# Patient Record
Sex: Male | Born: 1990 | Race: Black or African American | Hispanic: No | Marital: Single | Smoking: Never smoker
Health system: Southern US, Community
[De-identification: ages and names within clinical notes are randomized; demographics above are authoritative.]

## PROBLEM LIST (undated history)

## (undated) DIAGNOSIS — K08409 Partial loss of teeth, unspecified cause, unspecified class: Secondary | ICD-10-CM

## (undated) DIAGNOSIS — Z8619 Personal history of other infectious and parasitic diseases: Secondary | ICD-10-CM

## (undated) HISTORY — DX: Personal history of other infectious and parasitic diseases: Z86.19

## (undated) HISTORY — DX: Partial loss of teeth, unspecified cause, unspecified class: K08.409

## (undated) HISTORY — PX: EXTERNAL EAR SURGERY: SHX627

---

## 2012-04-30 ENCOUNTER — Ambulatory Visit (INDEPENDENT_AMBULATORY_CARE_PROVIDER_SITE_OTHER): Payer: Managed Care, Other (non HMO) | Admitting: Family Medicine

## 2012-04-30 ENCOUNTER — Encounter: Payer: Self-pay | Admitting: Family Medicine

## 2012-04-30 VITALS — BP 110/68 | HR 80 | Temp 98.5°F | Ht 70.25 in | Wt 219.2 lb

## 2012-04-30 DIAGNOSIS — Z0000 Encounter for general adult medical examination without abnormal findings: Secondary | ICD-10-CM

## 2012-04-30 DIAGNOSIS — Z209 Contact with and (suspected) exposure to unspecified communicable disease: Secondary | ICD-10-CM

## 2012-04-30 DIAGNOSIS — Z131 Encounter for screening for diabetes mellitus: Secondary | ICD-10-CM

## 2012-04-30 DIAGNOSIS — Z13220 Encounter for screening for lipoid disorders: Secondary | ICD-10-CM

## 2012-04-30 LAB — BASIC METABOLIC PANEL
BUN: 11 mg/dL (ref 6–23)
Chloride: 102 mEq/L (ref 96–112)
Glucose, Bld: 69 mg/dL — ABNORMAL LOW (ref 70–99)
Potassium: 4.1 mEq/L (ref 3.5–5.1)

## 2012-04-30 NOTE — Progress Notes (Signed)
Nature conservation officer at [facility] [address] [place] [address] Phone: [phone] Fax: [phone]   Patient Name: Frank Macdonald Date of Birth: 01-Jan-1991 Medical Record Number: [id number] Gender: male Date of Encounter: 04/30/2012  History of Present Illness:  Frank Macdonald is a 21 y.o. very pleasant male patient who presents with the following:  Frank Macdonald, [place], [place].  Preventative Health Maintenance Visit:  Health Maintenance Summary Reviewed and updated, unless pt declines services.  Tobacco History Reviewed. Alcohol: No concerns, no excessive use Exercise Habits: Some activity, rec at least 30 mins 5 times a week - looking for gym, basketball STD concerns: no risk or activity to increase risk Drug Use: None Encouraged self-testicular check  No health maintenance topics applied.   There is no problem list on file for this patient.  Past Medical History  Diagnosis Date  . History of chicken pox   . Wisdom teeth extracted    Past Surgical History  Procedure Date  . External ear surgery    History  Substance Use Topics  . Smoking status: Never Smoker   . Smokeless tobacco: Never Used  . Alcohol Use: Yes     socially   No family history on file. No Known Allergies  Medication list has been reviewed and updated.  Prior to Admission medications   Not on File    Review of Systems:   General: Denies fever, chills, sweats. No significant weight loss. Eyes: Denies blurring,significant itching ENT: Denies earache, sore throat, and hoarseness. Cardiovascular: Denies chest pains, palpitations, dyspnea on exertion Respiratory: Denies cough, dyspnea at rest,wheeezing Breast: no concerns about lumps GI: Denies nausea, vomiting, diarrhea, constipation, change in bowel habits, abdominal pain, melena, hematochezia GU: Denies penile discharge, ED, urinary flow / outflow problems. No STD concerns. Musculoskeletal: Denies back pain,  joint pain Derm: Denies rash, itching Neuro: Denies  paresthesias, frequent falls, frequent headaches Psych: Denies depression, anxiety Endocrine: Denies cold intolerance, heat intolerance, polydipsia Heme: Denies enlarged lymph nodes Allergy: No hayfever   Physical Examination: Filed Vitals:   04/30/12 1403  BP: 110/68  Pulse: 80  Temp: 98.5 F (36.9 C)   Filed Vitals:   04/30/12 1403  Height: 5' 10.25" (1.784 m)  Weight: 219 lb 4 oz (99.451 kg)   Body mass index is 31.24 kg/(m^2).  GEN: well developed, well nourished, no acute distress Eyes: conjunctiva and lids normal, PERRLA, EOMI ENT: TM clear, nares clear, oral exam WNL Neck: supple, no lymphadenopathy, no thyromegaly, no JVD Pulm: clear to auscultation and percussion, respiratory effort normal CV: regular rate and rhythm, S1-S2, no murmur, rub or gallop, no bruits, peripheral pulses normal and symmetric, no cyanosis, clubbing, edema or varicosities GI: soft, non-tender; no hepatosplenomegaly, masses; active bowel sounds all quadrants GU: no hernia, testicular mass, penile discharge Lymph: no cervical, axillary or inguinal adenopathy MSK: gait normal, muscle tone and strength WNL, no joint swelling, effusions, discoloration, crepitus  SKIN: clear, good turgor, color WNL, no rashes, lesions, or ulcerations Neuro: normal mental status, normal strength, sensation, and motion Psych: alert; oriented to person, place and time, normally interactive and not anxious or depressed in appearance.   Assessment and Plan: 1. Routine general medical examination at a health care facility    2. Exposure to communicable disease  HIV antibody, RPR, GC/chlamydia probe amp, urine  3. Screening for lipoid disorders  LDL cholesterol, direct  4. Screening for diabetes mellitus  Basic metabolic panel    The patient's preventative maintenance and recommended  screening tests for an annual wellness exam were reviewed in full today. Brought up  to date unless services declined.  Counselled on the importance of diet, exercise, and its role in overall health and mortality. The patient's FH and SH was reviewed, including their home life, tobacco status, and drug and alcohol status.   Hannah Beat, MD

## 2012-05-02 ENCOUNTER — Encounter: Payer: Self-pay | Admitting: *Deleted

## 2012-05-23 ENCOUNTER — Telehealth: Payer: Self-pay | Admitting: *Deleted

## 2012-05-23 NOTE — Telephone Encounter (Signed)
Pt's mother's coworker (susan)  called re pt. He was seen as a new patient last month 04/30/12 and had labs. Per Darl Pikes the labs were supposed to go to [facility], but they recieved a bill from [facility] because the labs went there. Per Darl Pikes pt states he informed someone that the labs were to go to [facility] and that it is also on his insurance card. Requesting call back re bill being taken care of due to the error. Can reach pt's mother Camile @ [phone] during the day.

## 2012-05-23 NOTE — Telephone Encounter (Signed)
Frank Macdonald, can you help me with this? Since we made the error, my call is to comp it. We can discuss on Monday if needed.

## 2012-05-28 NOTE — Telephone Encounter (Signed)
Terri,  Can you assist with this please?  Can you contact the lab to have it billed to [place] and/or work on the reimbursement end or do you need for me to call to take care of it?  Please let me know.  Thanks, [employer]

## 2012-05-28 NOTE — Telephone Encounter (Signed)
Terri, can you help me with this like we discussed?

## 2012-05-31 NOTE — Telephone Encounter (Signed)
Terri, I'm following up on phone notes this morning.  Was this taken care of?  How can I assist?  Thanks, Jamesetta So

## 2012-05-31 NOTE — Telephone Encounter (Signed)
I spoke to mother just now. I spoke to Terri earlier and asked for her to talk to [facility] and have our office handle it.

## 2012-06-07 NOTE — Telephone Encounter (Signed)
Can you send me an update on this one please?

## 2012-06-07 NOTE — Telephone Encounter (Signed)
I gave you the paperwork, [facility] has sent his insurance a retraction for the charges. We will be billed.

## 2018-05-06 ENCOUNTER — Encounter: Payer: Self-pay | Admitting: Gynecology

## 2018-05-06 ENCOUNTER — Ambulatory Visit (INDEPENDENT_AMBULATORY_CARE_PROVIDER_SITE_OTHER): Payer: Managed Care, Other (non HMO)

## 2018-05-06 ENCOUNTER — Ambulatory Visit
Admission: EM | Admit: 2018-05-06 | Discharge: 2018-05-06 | Disposition: A | Discharge status: Home / Self Care | Payer: Managed Care, Other (non HMO) | Attending: Emergency Medicine | Admitting: Emergency Medicine

## 2018-05-06 ENCOUNTER — Ambulatory Visit
Admit: 2018-05-06 | Discharge: 2018-05-06 | Disposition: A | Discharge status: Home / Self Care | Payer: Managed Care, Other (non HMO) | Attending: Family Medicine | Admitting: Family Medicine

## 2018-05-06 ENCOUNTER — Other Ambulatory Visit: Payer: Self-pay

## 2018-05-06 DIAGNOSIS — R1011 Right upper quadrant pain: Secondary | ICD-10-CM

## 2018-05-06 DIAGNOSIS — K760 Fatty (change of) liver, not elsewhere classified: Secondary | ICD-10-CM | POA: Diagnosis not present

## 2018-05-06 LAB — URINALYSIS, COMPLETE (UACMP) WITH MICROSCOPIC
BACTERIA UA: NONE SEEN
BILIRUBIN URINE: NEGATIVE
Glucose, UA: NEGATIVE mg/dL
KETONES UR: NEGATIVE mg/dL
Leukocytes, UA: NEGATIVE
Nitrite: NEGATIVE
PH: 6 (ref 5.0–8.0)
PROTEIN: 30 mg/dL — AB
SQUAMOUS EPITHELIAL / LPF: NONE SEEN (ref 0–5)
Specific Gravity, Urine: 1.02 (ref 1.005–1.030)

## 2018-05-06 LAB — CBC WITH DIFFERENTIAL/PLATELET
Basophils Absolute: 0 10*3/uL (ref 0–0.1)
Basophils Relative: 0 %
Eosinophils Absolute: 0.1 10*3/uL (ref 0–0.7)
Eosinophils Relative: 1 %
HEMATOCRIT: 43.6 % (ref 40.0–52.0)
HEMOGLOBIN: 14.6 g/dL (ref 13.0–18.0)
LYMPHS PCT: 18 %
Lymphs Abs: 2 10*3/uL (ref 1.0–3.6)
MCH: 30.3 pg (ref 26.0–34.0)
MCHC: 33.4 g/dL (ref 32.0–36.0)
MCV: 90.8 fL (ref 80.0–100.0)
Monocytes Absolute: 0.7 10*3/uL (ref 0.2–1.0)
Monocytes Relative: 6 %
Neutro Abs: 8.6 10*3/uL — ABNORMAL HIGH (ref 1.4–6.5)
Neutrophils Relative %: 75 %
Platelets: 252 10*3/uL (ref 150–440)
RBC: 4.8 MIL/uL (ref 4.40–5.90)
RDW: 13.7 % (ref 11.5–14.5)
WBC: 11.5 10*3/uL — AB (ref 3.8–10.6)

## 2018-05-06 LAB — BASIC METABOLIC PANEL
Anion gap: 11 (ref 5–15)
BUN: 16 mg/dL (ref 6–20)
CALCIUM: 9 mg/dL (ref 8.9–10.3)
CHLORIDE: 101 mmol/L (ref 101–111)
CO2: 24 mmol/L (ref 22–32)
Creatinine, Ser: 1.1 mg/dL (ref 0.61–1.24)
GFR calc Af Amer: >60 mL/min (ref >60)
GFR calc non Af Amer: >60 mL/min (ref >60)
GLUCOSE: 103 mg/dL — AB (ref 65–99)
Potassium: 3.7 mmol/L (ref 3.5–5.1)
Sodium: 136 mmol/L (ref 135–145)

## 2018-05-06 LAB — LIPASE, BLOOD: Lipase: 35 U/L (ref 11–51)

## 2018-05-06 NOTE — Discharge Instructions (Signed)
Go directly for ultrasound as discussed.   Follow up with your primary care physician this week as needed. Return to Urgent care for new or worsening concerns.

## 2018-05-06 NOTE — ED Provider Notes (Signed)
[facility] ____________________________________________  Time seen: Approximately 8:43 AM  I have reviewed the triage vital signs and the nursing notes.   HISTORY  Chief Complaint Abdominal Pain  HPI Frank Macdonald is a 27 y.o. male presented for evaluation of right upper quadrant abdominal pain present intermittently since last Tuesday.  Patient reports pain coming and going.  Does feel that food worsens pain.  Denies other possible triggers.  No associated fevers.  Denies change in appetite.  No nausea or vomiting, did have some loose stool earlier in the week, but reports normal coloration.  Denies any trauma or rash.  No previous abdominal issues or surgeries.  Reports pain is currently 2 out of 10, reports much stronger pain last night.  Denies any fevers.  Did take some ibuprofen last night which has seemed to help some.  Denies any other alleviating measures.  Reports otherwise feels well. Denies chest pain, shortness of breath, dysuria, or rash. Denies recent sickness. Denies recent antibiotic use.   PCP: Cordelia Poche   Past Medical History:  Diagnosis Date  . History of chicken pox   . Wisdom teeth extracted     There are no active problems to display for this patient.   Past Surgical History:  Procedure Laterality Date  . EXTERNAL EAR SURGERY       No current facility-administered medications for this encounter.  No current outpatient medications on file.  Allergies Patient has no known allergies.  Family History  Problem Relation Age of Onset  . Healthy Mother   . Hypertension Father     Social History Social History   Tobacco Use  . Smoking status: Never Smoker  . Smokeless tobacco: Never Used  Substance Use Topics  . Alcohol use: Yes    Comment: socially  . Drug use: No    Review of Systems Constitutional: No fever/chills ENT: No sore throat. Cardiovascular: Denies chest pain. Respiratory: Denies shortness of  breath. Gastrointestinal:  As above. Genitourinary: Negative for dysuria. Musculoskeletal: Negative for back pain. Skin: Negative for rash.   ____________________________________________   PHYSICAL EXAM:  VITAL SIGNS: ED Triage Vitals  Enc Vitals Group     BP 05/06/18 0812 125/85     Pulse Rate 05/06/18 0812 76     Resp --      Temp 05/06/18 0812 98.3 F (36.8 C)     Temp Source 05/06/18 0812 Oral     SpO2 05/06/18 0812 100 %     Weight 05/06/18 0814 260 lb (117.9 kg)     Height 05/06/18 0814 5\' 11"  (1.803 m)     Head Circumference --      Peak Flow --      Pain Score 05/06/18 0813 3     Pain Loc --      Pain Edu? --      Excl. in GC? --     Constitutional: Alert and oriented. Well appearing and in no acute distress. Eyes: Conjunctivae are normal. ENT      Head: Normocephalic and atraumatic. Cardiovascular: Normal rate, regular rhythm. Grossly normal heart sounds.  Good peripheral circulation. Respiratory: Normal respiratory effort without tachypnea nor retractions. Breath sounds are clear and equal bilaterally. No wheezes, rales, rhonchi. Gastrointestinal: Normal Bowel sounds.  Right upper quadrant mild tenderness to direct palpation, negative Murphy sign, abdomen otherwise soft nontender.  Negative table tap. No CVA tenderness. Musculoskeletal: Steady gait. Neurologic:  Normal speech and language. Speech is normal. No gait instability.  Skin:  Skin is  warm, dry and intact. No rash noted. Psychiatric: Mood and affect are normal. Speech and behavior are normal. Patient exhibits appropriate insight and judgment   ___________________________________________   LABS (all labs ordered are listed, but only abnormal results are displayed)  Labs Reviewed  CBC WITH DIFFERENTIAL/PLATELET - Abnormal; Notable for the following components:      Result Value   WBC 11.5 (*)    Neutro Abs 8.6 (*)    All other components within normal limits  BASIC METABOLIC PANEL - Abnormal;  Notable for the following components:   Glucose, Bld 103 (*)    All other components within normal limits  URINALYSIS, COMPLETE (UACMP) WITH MICROSCOPIC - Abnormal; Notable for the following components:   APPearance HAZY (*)    Hgb urine dipstick TRACE (*)    Protein, ur 30 (*)    All other components within normal limits  LIPASE, BLOOD    RADIOLOGY  Dg Abdomen 1 View  Result Date: 05/06/2018 CLINICAL DATA:  Right upper quadrant pain, nausea EXAM: ABDOMEN - 1 VIEW COMPARISON:  None. FINDINGS: The bowel gas pattern is normal. No radio-opaque calculi or other significant radiographic abnormality are seen. IMPRESSION: Negative. Electronically Signed   By: Charlett NoseKevin  Dover M.D.   On: 05/06/2018 09:42   [place] Abdomen Limited Ruq  Result Date: 05/06/2018 CLINICAL DATA:  Right upper quadrant pain EXAM: ULTRASOUND ABDOMEN LIMITED RIGHT UPPER QUADRANT COMPARISON:  None. FINDINGS: Gallbladder: Small amount of sludge within the gallbladder. No stones or wall thickening. Negative sonographic Murphy's. Common bile duct: Diameter: Normal caliber, 4 mm Liver: Increased echotexture compatible with fatty infiltration. No focal abnormality or biliary ductal dilatation. Focal fatty sparing adjacent to the gallbladder fossa. Portal vein is patent on color Doppler imaging with normal direction of blood flow towards the liver. IMPRESSION: Fatty infiltration of the liver. Small amount of sludge within the gallbladder. No stones or evidence of cholecystitis. Electronically Signed   By: Charlett NoseKevin  Dover M.D.   On: 05/06/2018 10:40   ____________________________________________   PROCEDURES Procedures    INITIAL IMPRESSION / ASSESSMENT AND PLAN / ED COURSE  Pertinent labs & imaging results that were available during my care of the patient were reviewed by me and considered in my medical decision making (see chart for details).  Well-appearing patient.  No acute distress.  Minimal pain at this time.  Suspect gall pain.   Will evaluate labs and urine at this time.  And then obtain ultrasound.  Labs, x-ray as well as ultrasound results discussed with patient.  This patient did have trace hemoglobin and RBCs with crystals on x-ray, KUB was evaluated for stone, results as above per radiologist were negative.  Right upper quadrant abdominal ultrasound per radiologist, fatty liver, small amount of sludge within the gallbladder, no stones or evidence of cholecystitis.  Recommend for patient to follow-up with GI outpatient this week.  Encourage low-fat diet and close monitoring of symptoms as well as triggers.  Discussed strict follow-up and return parameters.  Discussed follow up with Primary care physician this week. Discussed follow up and return parameters including no resolution or any worsening concerns. Patient verbalized understanding and agreed to plan.   ____________________________________________   FINAL CLINICAL IMPRESSION(S) / ED DIAGNOSES  Final diagnoses:  RUQ abdominal pain     ED Discharge Orders        Ordered    US Abdomen Limited RUQ     05/06/18 0831       Note: This dictation was prepared with  Dragon dictation along with smaller Lobbyist. Any transcriptional errors that result from this process are unintentional.         Renford Dills, NP 05/06/18 1106

## 2018-05-06 NOTE — ED Triage Notes (Signed)
Patient c/o right upper abdominal pain rotating to his back for couple days.

## 2019-05-11 IMAGING — US US ABDOMEN LIMITED
1 series · 14 of 25 positions shown · non-contrast
Comparison: None.

CLINICAL DATA: Right upper quadrant pain

EXAM:
ULTRASOUND ABDOMEN LIMITED RIGHT UPPER QUADRANT

[Series 1: us abdomen limited · 14 of 45 slices shown]
[im 1/45]
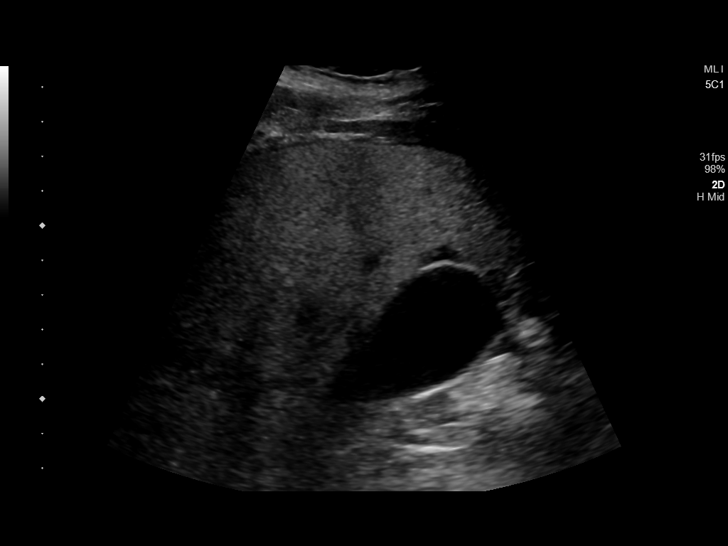
[im 4/45]
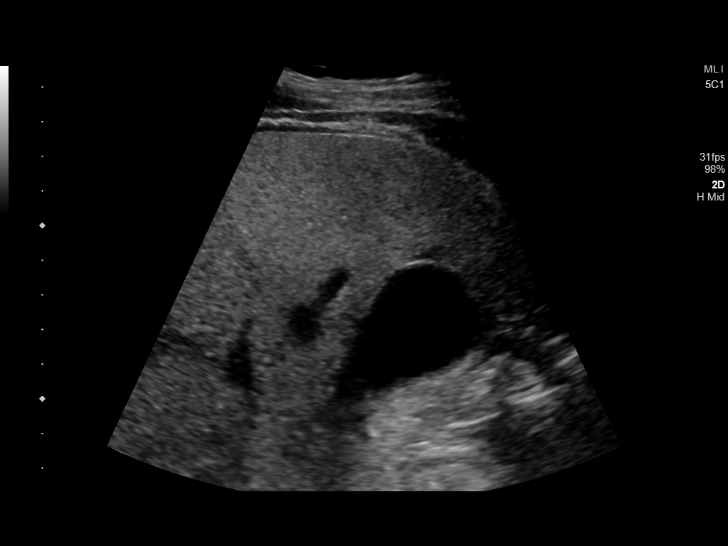
[im 8/45]
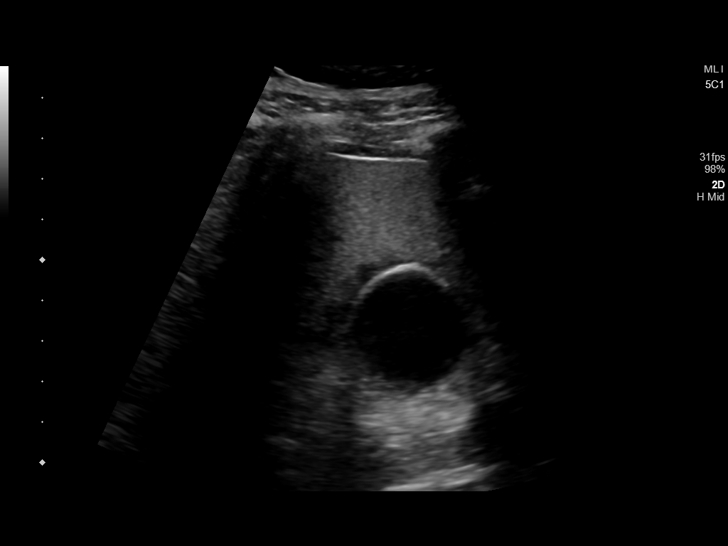
[im 12/45]
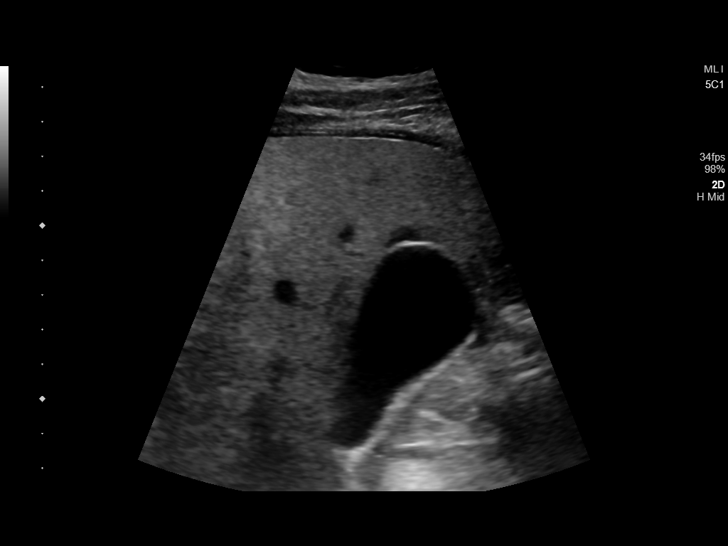
[im 15/45]
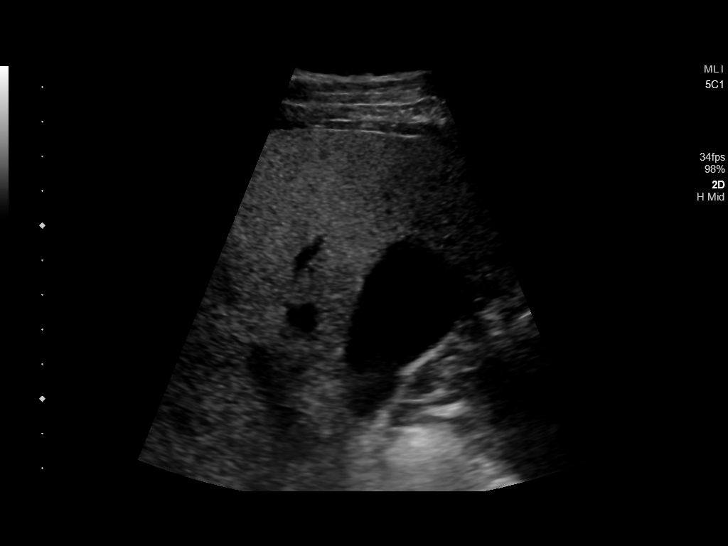
[im 17/45]
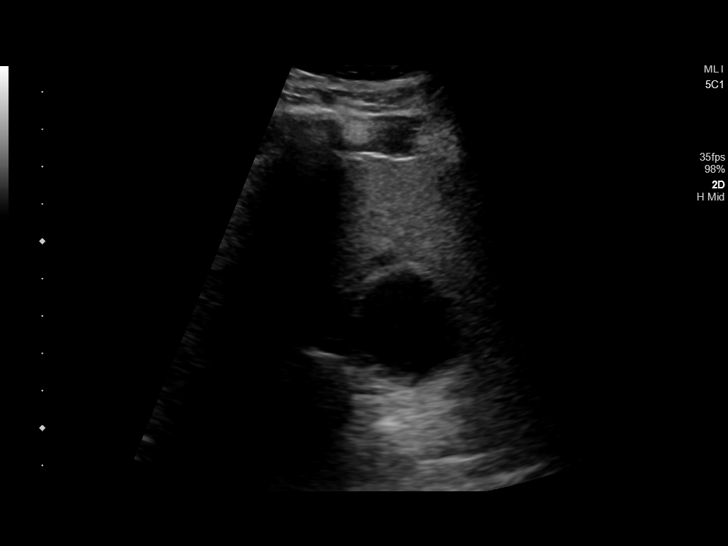
[im 21/45]
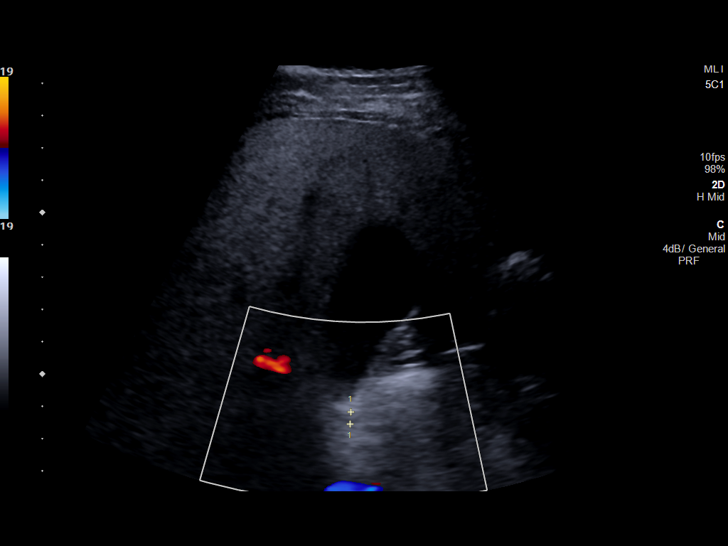
[im 24/45]
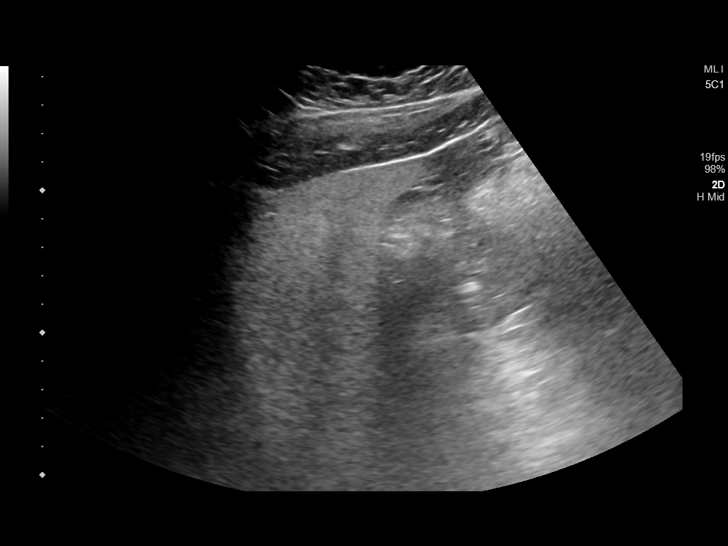
[im 28/45]
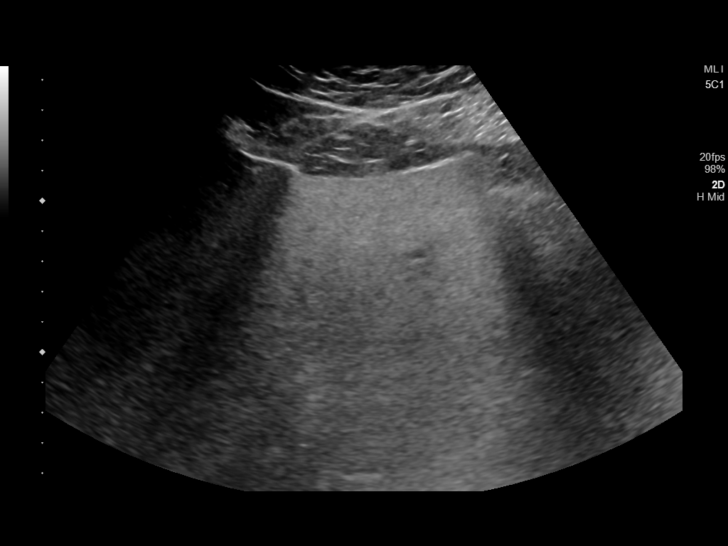
[im 30/45]
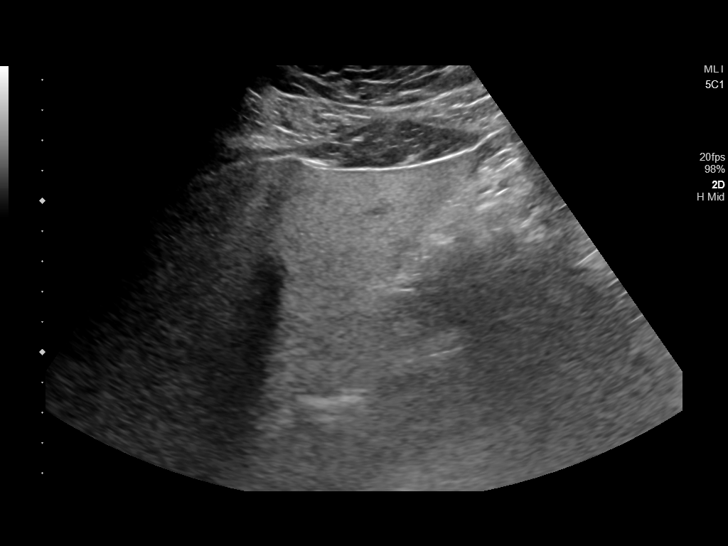
[im 34/45]
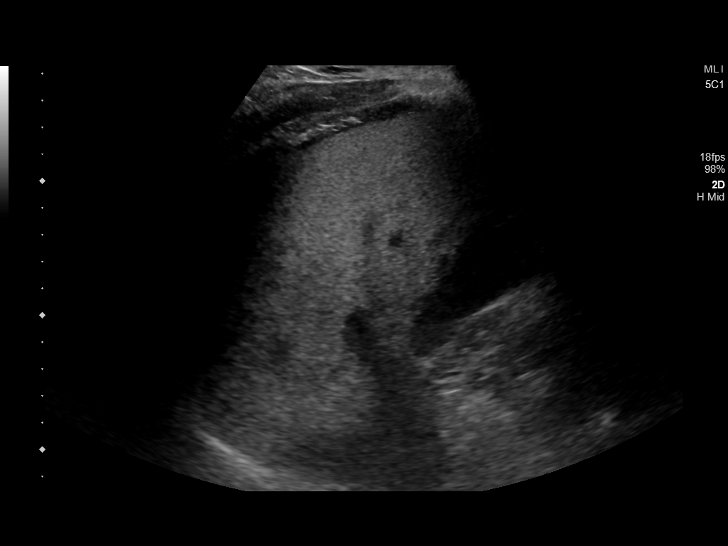
[im 37/45]
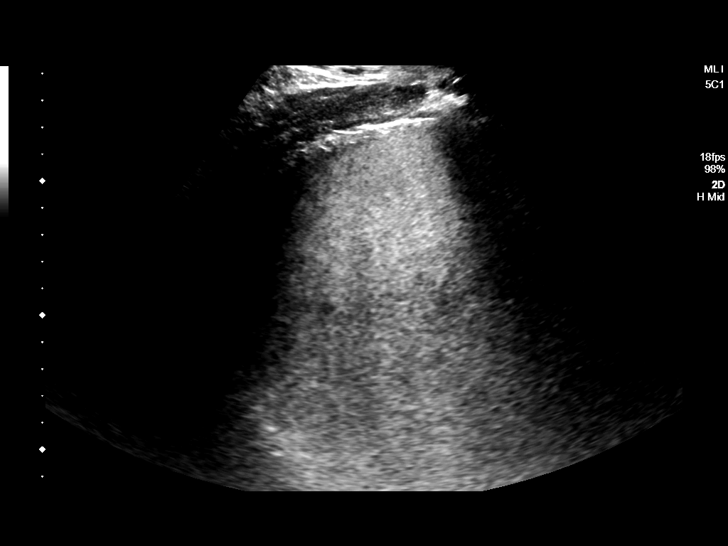
[im 41/45]
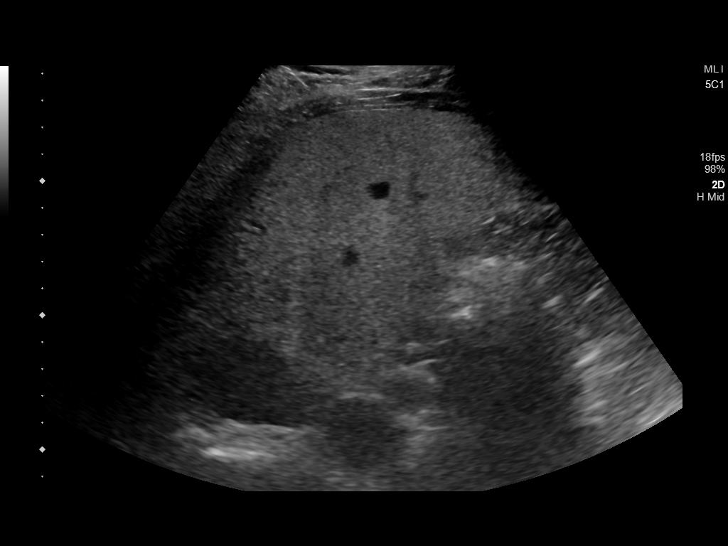
[im 45/45]
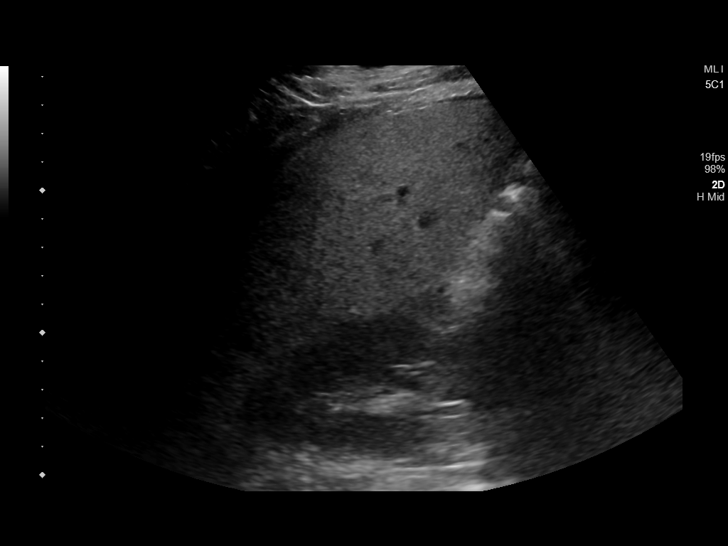

[14 of 25 positions shown; findings below may reference images not displayed]

FINDINGS: Gallbladder:

Small amount of sludge within the gallbladder. No stones or wall
thickening. Negative sonographic Nazf.

Common bile duct:

Diameter: Normal caliber, 4 mm

Liver:

Increased echotexture compatible with fatty infiltration. No focal
abnormality or biliary ductal dilatation. Focal fatty sparing
adjacent to the gallbladder fossa. Portal vein is patent on color
Doppler imaging with normal direction of blood flow towards the
liver.
IMPRESSION: Fatty infiltration of the liver.

Small amount of sludge within the gallbladder. No stones or evidence
of cholecystitis.

## 2019-05-11 IMAGING — CR DG ABDOMEN 1V
2 series · 2 of 2 positions shown · non-contrast
Comparison: None.

CLINICAL DATA: Right upper quadrant pain, nausea

EXAM:
ABDOMEN - 1 VIEW

[abdomen kub (1 of 2)]
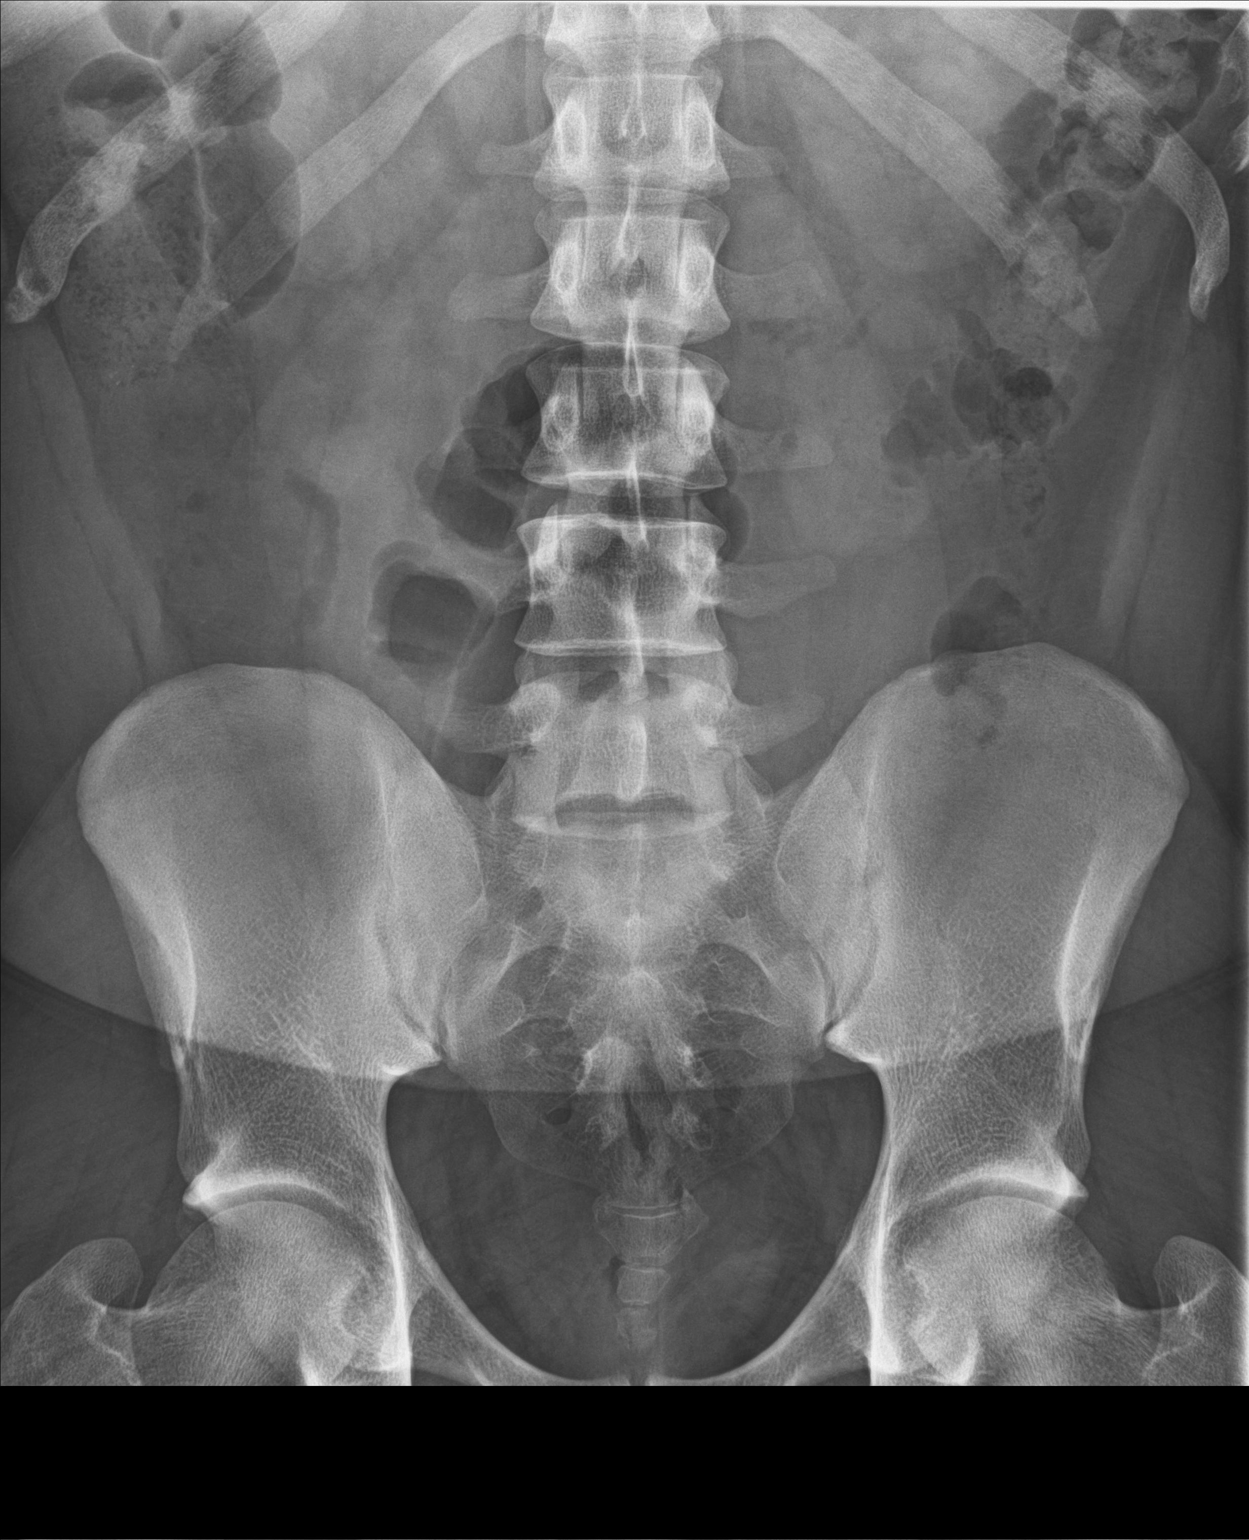

[abdomen kub (2 of 2)]
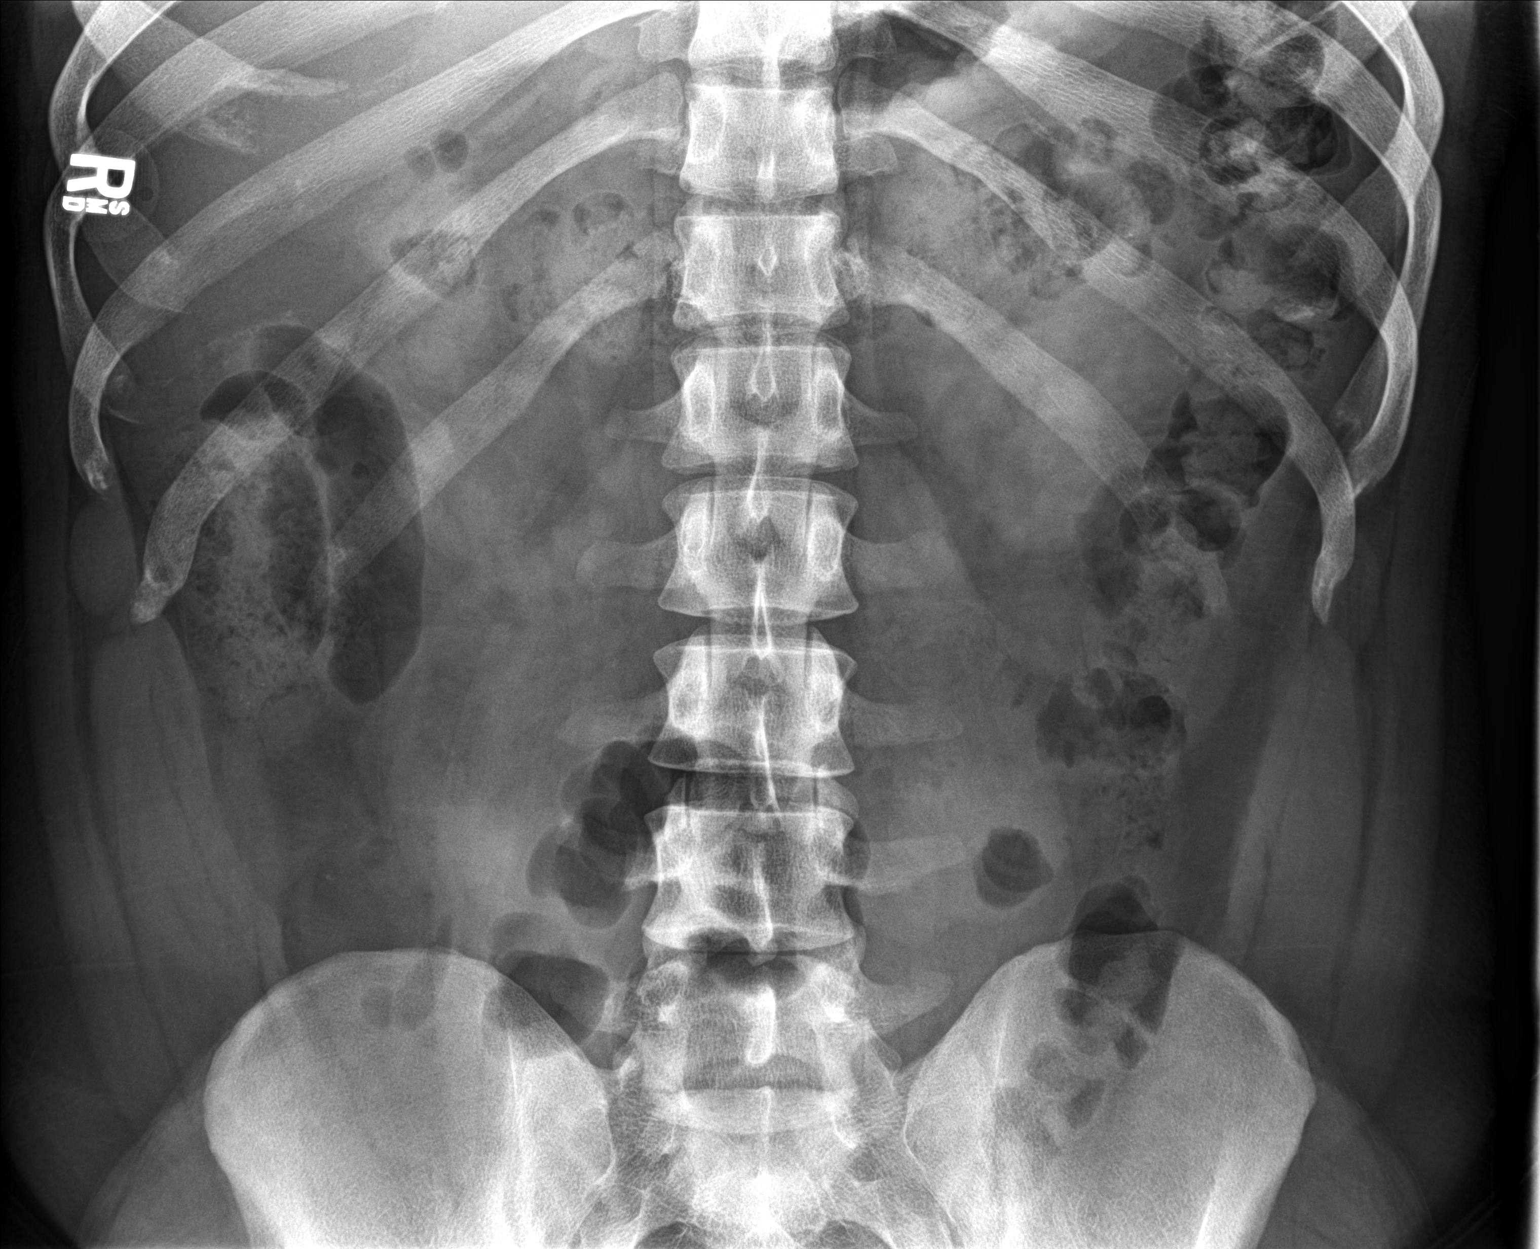

[2 of 2 positions shown; findings below may reference images not displayed]

FINDINGS: The bowel gas pattern is normal. No radio-opaque calculi or other
significant radiographic abnormality are seen.
IMPRESSION: Negative.

## 2019-10-11 ENCOUNTER — Other Ambulatory Visit: Payer: Self-pay

## 2019-10-11 DIAGNOSIS — Z20822 Contact with and (suspected) exposure to covid-19: Secondary | ICD-10-CM

## 2019-10-14 LAB — NOVEL CORONAVIRUS, NAA: SARS-CoV-2, NAA: NOT DETECTED

## 2020-03-07 ENCOUNTER — Ambulatory Visit: Payer: Self-pay | Attending: Oncology

## 2020-03-07 DIAGNOSIS — Z23 Encounter for immunization: Secondary | ICD-10-CM

## 2020-03-07 NOTE — Progress Notes (Signed)
   Covid-19 Vaccination Clinic  Name:  Frank Macdonald    MRN: [id number] DOB: 25-Aug-1991  03/07/2020  Mr. Frank Macdonald was observed post Covid-19 immunization for 15 minutes without incident. He was provided with Vaccine Information Sheet and instruction to access the V-Safe system.   Mr. Frank Macdonald was instructed to call 911 with any severe reactions post vaccine: Marland Kitchen Difficulty breathing  . Swelling of face and throat  . A fast heartbeat  . A bad rash all over body  . Dizziness and weakness   Immunizations Administered    Name Date Dose VIS Date Route   Pfizer COVID-19 Vaccine 03/07/2020  8:12 AM 0.3 mL 11/01/2019 Intramuscular   Manufacturer: [employer], [employer]   Lot: [id number]   NDC: [address]-1

## 2020-03-28 ENCOUNTER — Ambulatory Visit: Payer: Self-pay | Attending: Internal Medicine

## 2020-03-28 DIAGNOSIS — Z23 Encounter for immunization: Secondary | ICD-10-CM

## 2020-03-28 NOTE — Progress Notes (Signed)
   Covid-19 Vaccination Clinic  Name:  Frank Macdonald    MRN: [id number] DOB: 05-15-1991  03/28/2020  [name] was observed post Covid-19 immunization for 15 minutes without incident. He was provided with Vaccine Information Sheet and instruction to access the V-Safe system.   [name] was instructed to call 911 with any severe reactions post vaccine: Marland Kitchen Difficulty breathing  . Swelling of face and throat  . A fast heartbeat  . A bad rash all over body  . Dizziness and weakness   Immunizations Administered    Name Date Dose VIS Date Route   Pfizer COVID-19 Vaccine 03/28/2020  8:48 AM 0.3 mL 01/15/2019 Intramuscular   Manufacturer: [employer], [employer]   Lot: [id number]   NDC: [address]-2
